# Patient Record
Sex: Female | Born: 1990 | Race: White | Hispanic: No | Marital: Single | Smoking: Never smoker
Health system: Southern US, Community
[De-identification: ages and names within clinical notes are randomized; demographics above are authoritative.]

## PROBLEM LIST (undated history)

## (undated) DIAGNOSIS — F419 Anxiety disorder, unspecified: Secondary | ICD-10-CM

## (undated) DIAGNOSIS — D649 Anemia, unspecified: Secondary | ICD-10-CM

## (undated) DIAGNOSIS — I499 Cardiac arrhythmia, unspecified: Secondary | ICD-10-CM

## (undated) DIAGNOSIS — T7840XA Allergy, unspecified, initial encounter: Secondary | ICD-10-CM

## (undated) HISTORY — PX: TONSILLECTOMY AND ADENOIDECTOMY: SUR1326

## (undated) HISTORY — DX: Allergy, unspecified, initial encounter: T78.40XA

## (undated) HISTORY — DX: Anxiety disorder, unspecified: F41.9

---
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
## 2011-07-17 ENCOUNTER — Emergency Department (HOSPITAL_COMMUNITY)
Admission: EM | Admit: 2011-07-17 | Discharge: 2011-07-17 | Disposition: A | Discharge status: Home / Self Care | Payer: 59 | Attending: Emergency Medicine | Admitting: Emergency Medicine

## 2011-07-17 ENCOUNTER — Emergency Department (HOSPITAL_COMMUNITY): Payer: 59

## 2011-07-17 DIAGNOSIS — M79609 Pain in unspecified limb: Secondary | ICD-10-CM | POA: Insufficient documentation

## 2011-07-17 DIAGNOSIS — R51 Headache: Secondary | ICD-10-CM | POA: Insufficient documentation

## 2011-07-17 DIAGNOSIS — S8010XA Contusion of unspecified lower leg, initial encounter: Secondary | ICD-10-CM | POA: Insufficient documentation

## 2011-07-17 DIAGNOSIS — R413 Other amnesia: Secondary | ICD-10-CM | POA: Insufficient documentation

## 2011-07-17 DIAGNOSIS — R079 Chest pain, unspecified: Secondary | ICD-10-CM | POA: Insufficient documentation

## 2011-07-17 DIAGNOSIS — R404 Transient alteration of awareness: Secondary | ICD-10-CM | POA: Insufficient documentation

## 2011-07-17 DIAGNOSIS — Z79899 Other long term (current) drug therapy: Secondary | ICD-10-CM | POA: Insufficient documentation

## 2011-07-17 DIAGNOSIS — IMO0002 Reserved for concepts with insufficient information to code with codable children: Secondary | ICD-10-CM | POA: Insufficient documentation
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
## 2013-01-06 ENCOUNTER — Encounter: Payer: Self-pay | Admitting: *Deleted

## 2013-01-08 ENCOUNTER — Ambulatory Visit (INDEPENDENT_AMBULATORY_CARE_PROVIDER_SITE_OTHER): Payer: 59 | Admitting: Family Medicine

## 2013-01-08 ENCOUNTER — Encounter: Payer: Self-pay | Admitting: Family Medicine

## 2013-01-08 VITALS — BP 112/82 | Temp 98.4°F | Wt 121.2 lb

## 2013-01-08 DIAGNOSIS — K2970 Gastritis, unspecified, without bleeding: Secondary | ICD-10-CM

## 2013-01-08 DIAGNOSIS — K2990 Gastroduodenitis, unspecified, without bleeding: Secondary | ICD-10-CM

## 2013-01-08 MED ORDER — PANTOPRAZOLE SODIUM 40 MG PO TBEC: 40.0000 mg | DELAYED_RELEASE_TABLET | Freq: Every day | ORAL | Status: DC

## 2013-01-08 MED ORDER — SUCRALFATE 1 G PO TABS: ORAL_TABLET | ORAL | Status: DC

## 2013-01-08 NOTE — Progress Notes (Signed)
  Subjective:    Patient ID: Linda Bryan, female    DOB: [birth year], 22 y.o.   MRN: [id number]  Emesis  This is a chronic problem. The current episode started more than 1 month ago. The problem has been gradually worsening. There has been no fever. Associated symptoms include abdominal pain. She has tried nothing for the symptoms. The treatment provided mild relief.  Abdominal Pain The current episode started more than 1 month ago. The problem occurs daily. The pain is located in the epigastric region. The pain is at a severity of 5/10. The pain is moderate. The quality of the pain is burning, aching and a sensation of fullness. The abdominal pain radiates to the epigastric region. Associated symptoms include vomiting. The pain is aggravated by NSAIDs. She has tried proton pump inhibitors for the symptoms. The treatment provided mild relief. There is no history of abdominal surgery.      Review of Systems  Constitutional: Positive for fatigue.  Gastrointestinal: Positive for vomiting and abdominal pain.  All other systems reviewed and are negative.       Objective:   Physical Exam   Alert no acute distress. HEENT normal. Vital signs reviewed. Lungs clear. Heart regular in rhythm. Epigastrium distinct tenderness. No rebound no guarding no masses.     Assessment & Plan:  Impression gastritis recurring throughout the past 6 months. No prior history. Discussed at great length. Plan H. pylori serology. If positive will treat. If negative we'll set up a GI specialist. Rationale discussed. Easily 25 minutes spent with patient most in discussion WSL

## 2013-01-08 NOTE — Patient Instructions (Addendum)
Helicobacter Pylori and Ulcer Disease  An ulcer may be in your stomach (gastric ulcer) or in the first part of your small bowel, which is called the duodenum (duodenal ulcer). An ulcer is a break in the stomach or duodenum lining. The break wears down into the deeper tissue. Helicobacter pylori (H. pylori) is a type of germ (bacteria) that may cause the majority of gastric or duodenal ulcers.  CAUSES   · A germ (bacterium). H. pylori can weaken the protective mucous coating of the stomach and duodenum. This allows acid to get through to the sensitive lining of the stomach or duodenum and an ulcer can then form.  · Certain medications.  · Using substances that can bother the lining of the stomach (alcohol, tobacco or medications such as Advil or Motrin) in the presence of H.pylori infection. This can increase the chances of getting an ulcer.  · Cancer (rarely).  Most people infected with H. pylori do not get ulcers. It is not known how people catch H. pylori. It may be through food or water. H. pylori has been found in the saliva of some infected people. Therefore, the bacteria may also spread through mouth-to-mouth contact such as kissing.  SYMPTOMS   The problems (symptoms) of ulcer disease are usually:  · A burning or gnawing of the mid-upper belly (abdomen). This is often worse on an empty stomach. It may get better with food. This may be associated with feeling sick to your stomach (nausea), bloating and vomiting.  · If the ulcer results in bleeding, it can cause:  · Black, tarry stools.  · Throwing up bright red blood.  · Throwing up coffee ground looking materials.  With severe bleeding, there may be loss of consciousness and shock.  Besides ulcer disease, H. pylori can also cause chronic gastritis (irritation of the lining of the stomach without ulcer) or stomach acid-type discomfort. You may not have symptoms even though you have an H. pylori infection. Although this is an infection, you may not have usual  infection symptoms (such as fever).  DIAGNOSIS   Ulcer disease can be diagnosed in many different ways. If you have an ulcer, it is important to know whether or not it is caused by H. Pylori. Treatment for an ulcer caused by H. pylori is different from that for an ulcer with other causes. The best way to detect H. pylori is taking tissue directly from the ulcer during an endoscopy test.   · An endoscopy is an exam that uses an endoscope. This is a thin, lighted tube with a small camera on the end. It is like a flexible telescope. The patient is given a drug to make them calm (sedative). The caregiver eases the endoscope into the mouth and down the throat to the stomach and duodenum. This allows the doctor to see the lining of the esophagus, stomach and duodenum.  · If an endoscopy is not needed, then H. pylori can be detected with tests of the blood, stool or even breath.  TREATMENT   · H. pylori peptic ulcer treatment usually involves a combination of:  · Medicines that kill germs (antibiotics).  · Acid suppressors.  · Stomach protectors.  · The use of only one medication to treat H. pylori is not recommended. The most proven treatment is a 2 week course of treatment called triple therapy. It involves taking two antibiotics to kill the bacteria and either an acid suppressor or stomach-lining shield. Two-week triple therapy reduces ulcer symptoms,   kills the bacteria, and prevents ulcers from coming back in many patients.  · Unfortunately, patients may find triple therapy hard to do. This is because it involves taking as many as 20 pills a day. Also, the antibiotics used in triple therapy may cause mild side effects. These include nausea, vomiting, diarrhea, dark stools, a metallic taste in the mouth, dizziness, headache and yeast infections in women. Talk to your caregiver if you have any of these side effects.  HOME CARE INSTRUCTIONS   · Take your medications as directed and for as long as prescribed. Contact your  caregiver if you have problems or side effects from your medications.  · Continue regular work and usual activities unless told otherwise by your caregiver.  · Avoid tobacco, alcohol and caffeine. Tobacco use will decrease and slow healing.  · Avoid medications that are harmful. This includes aspirin and NSAIDS such as ibuprofen and naproxen.  · Avoid foods that seem to aggravate or cause discomfort.  · There are many over-the-counter products available to control stomach acid and other symptoms. Discuss these with your caregiver before using them. Do not  stop taking prescription medications for over-the-counter medications without talking with your caregiver.  · Special diets are not usually needed.  · Keep any follow-up appointments and blood tests as directed.  SEEK MEDICAL CARE IF:   · Your pain or other ulcer symptoms do not improve within a few days of starting treatment.  · You develop diarrhea. This can be a problem related to certain treatments.  · You have ongoing indigestion or heartburn even if your main ulcer symptoms are improved.  · You think you have any side effects from your medications or if you do not understand how to use your medications right.  SEEK IMMEDIATE MEDICAL CARE IF:   Any of the following happen:  · You develop bright red, rectal bleeding.  · You develop dark black, tarry stools.  · You throw up (vomit) blood.  · You become light-headed, weak, have fainting episodes, or become sweaty, cold and clammy.  · You have severe abdominal pain not controlled by medications. Do not take pain medications unless ordered by your caregiver.  MAKE SURE YOU:   · Understand these instructions.  · Will watch your condition.  · Will get help right away if you are not doing well or get worse.  Document Released: 12/16/2003 Document Revised: 12/18/2011 Document Reviewed: 05/14/2008  [employer]® Patient Information ©2013 [employer].

## 2013-01-09 LAB — H. PYLORI ANTIBODY, IGG: H Pylori IgG: 0.51 {ISR}

## 2013-01-23 ENCOUNTER — Telehealth: Payer: Self-pay | Admitting: Family Medicine

## 2013-01-23 DIAGNOSIS — R109 Unspecified abdominal pain: Secondary | ICD-10-CM

## 2013-01-23 NOTE — Telephone Encounter (Signed)
Referral ordered and sent to referral coordinator.

## 2013-01-23 NOTE — Telephone Encounter (Signed)
See result note on lab.(H pylori) I think that was before nurses were doing the referral and this fell thru the cracks. plz order referral so brendale can do

## 2013-01-23 NOTE — Telephone Encounter (Signed)
Patient called, ? If GI referral has been done?  Never came to my workque, please initiate in system so I may proceed

## 2013-02-12 ENCOUNTER — Other Ambulatory Visit: Payer: Self-pay | Admitting: Gastroenterology

## 2013-02-12 DIAGNOSIS — R1013 Epigastric pain: Secondary | ICD-10-CM

## 2013-02-12 DIAGNOSIS — R112 Nausea with vomiting, unspecified: Secondary | ICD-10-CM

## 2013-02-14 SURGERY — Surgical Case
Anesthesia: *Unknown

## 2013-03-04 ENCOUNTER — Encounter (HOSPITAL_COMMUNITY): Admission: RE | Admit: 2013-03-04 | Payer: 59 | Source: Ambulatory Visit

## 2013-03-04 ENCOUNTER — Ambulatory Visit (HOSPITAL_COMMUNITY)
Admission: RE | Admit: 2013-03-04 | Discharge: 2013-03-04 | Disposition: A | Discharge status: Home / Self Care | Payer: 59 | Source: Ambulatory Visit | Attending: Gastroenterology | Admitting: Gastroenterology

## 2013-03-04 DIAGNOSIS — R1013 Epigastric pain: Secondary | ICD-10-CM | POA: Insufficient documentation

## 2013-03-04 DIAGNOSIS — R112 Nausea with vomiting, unspecified: Secondary | ICD-10-CM

## 2013-03-04 DIAGNOSIS — K8020 Calculus of gallbladder without cholecystitis without obstruction: Secondary | ICD-10-CM | POA: Insufficient documentation

## 2013-03-06 ENCOUNTER — Encounter (HOSPITAL_COMMUNITY): Payer: No Typology Code available for payment source

## 2013-03-17 ENCOUNTER — Encounter (INDEPENDENT_AMBULATORY_CARE_PROVIDER_SITE_OTHER): Payer: Self-pay

## 2013-03-18 ENCOUNTER — Ambulatory Visit (INDEPENDENT_AMBULATORY_CARE_PROVIDER_SITE_OTHER): Payer: 59 | Admitting: General Surgery

## 2013-03-18 ENCOUNTER — Encounter (INDEPENDENT_AMBULATORY_CARE_PROVIDER_SITE_OTHER): Payer: Self-pay

## 2013-03-18 ENCOUNTER — Encounter (INDEPENDENT_AMBULATORY_CARE_PROVIDER_SITE_OTHER): Payer: Self-pay | Admitting: General Surgery

## 2013-03-18 VITALS — BP 110/76 | HR 96 | Resp 14 | Ht 64.0 in | Wt 115.0 lb

## 2013-03-18 DIAGNOSIS — K8020 Calculus of gallbladder without cholecystitis without obstruction: Secondary | ICD-10-CM

## 2013-03-18 DIAGNOSIS — K811 Chronic cholecystitis: Secondary | ICD-10-CM | POA: Insufficient documentation

## 2013-03-18 NOTE — Progress Notes (Signed)
Chief complaint:  Abdominal pain  HISTORY: Pt is a 22 yo female with pain since October 2013.  She started with a sense of fullness after eating, and now has frank pain after eating about 5 bites of food.  She has some weight loss.  She thinks that greasy foods make it worse.  She denies heartburn.  She has constant pain, but has worsening with food.  She has had negative EGD.  She has had some diarrhea, but no constipation.  She denies fevers/ chills.  She constantly feels nauseated.    Past Medical History  Diagnosis Date  . Allergy   . Anxiety     Past Surgical History  Procedure Laterality Date  . Tonsillectomy and adenoidectomy    . Lithotripsy  2010    Current Outpatient Prescriptions  Medication Sig Dispense Refill  . PARoxetine (PAXIL-CR) 25 MG 24 hr tablet Take 25 mg by mouth every morning.      . propranolol ER (INDERAL LA) 120 MG 24 hr capsule Take 120 mg by mouth daily.      . pantoprazole (PROTONIX) 40 MG tablet Take 1 tablet (40 mg total) by mouth daily.  30 tablet  1  . sucralfate (CARAFATE) 1 G tablet One tab ac and hs  60 tablet  0   No current facility-administered medications for this visit.     No Known Allergies   History reviewed. No pertinent family history.   History   Social History  . Marital Status: Single    Spouse Name: N/A    Number of Children: N/A  . Years of Education: N/A   Social History Main Topics  . Smoking status: Never Smoker   . Smokeless tobacco: None  . Alcohol Use: Yes  . Drug Use: No  . Sexually Active: None    REVIEW OF SYSTEMS - PERTINENT POSITIVES ONLY: 12 point review of systems negative other than HPI and PMH except for nausea, abdominal pain, vomiting  EXAM: Filed Vitals:   03/18/13 1337  BP: 110/76  Pulse: 96  Resp: 14   Filed Weights   03/18/13 1337  Weight: 115 lb (52.164 kg)   .  Gen:  No acute distress.  Well nourished and well groomed.   Neurological: Alert and oriented to person, place, and  time. Coordination normal.  Head: Normocephalic and atraumatic.  Eyes: Conjunctivae are normal. Pupils are equal, round, and reactive to light. No scleral icterus.  Neck: Normal range of motion. Neck supple. No tracheal deviation or thyromegaly present.  Cardiovascular: Normal rate, regular rhythm, normal heart sounds and intact distal pulses.  Exam reveals no gallop and no friction rub.  No murmur heard. Respiratory: Effort normal.  No respiratory distress. No chest wall tenderness. Breath sounds normal.  No wheezes, rales or rhonchi.  GI: Soft. Bowel sounds are normal. The abdomen is soft and nondistended.  There is mild epigastric tenderness.  There is no rebound and no guarding.  Musculoskeletal: Normal range of motion. Extremities are nontender.  Lymphadenopathy: No cervical, preauricular, postauricular or axillary adenopathy is present Skin: Skin is warm and dry. No rash noted. No diaphoresis. No erythema. No pallor. No clubbing, cyanosis, or edema.   Psychiatric: Normal mood and affect. Behavior is normal. Judgment and thought content normal.    LABORATORY RESULTS: Available labs are reviewed  CBC, CMET completely normal.  RADIOLOGY RESULTS: See E-Chart or I-Site for most recent results.  Images and reports are reviewed. RUQ u/s  IMPRESSION:  Solitary nonshadowing mobile  gallstone as described above with no  signs of associated cholecystitis.    ASSESSMENT AND PLAN: Cholelithiasis Pt with symptoms of chronic cholecystitis and cholelithiasis.  Will plan removal of gallbladder.  The surgical procedure was described to the patient in detail.  The patient was given Agricultural engineer. .  I discussed the incision type and location, the location of the gallbladder, the anatomy of the bile ducts and arteries, and the typical progression of surgery.  I discussed the possibility of converting to an open operation.  I advised of the risks of bleeding, infection, damage to other  structures (such as the bile duct, intestine or liver), bile leak, need for other procedures or surgeries, and post op diarrhea/constipation.  We discussed the risk of blood clot.  We discussed the recovery period and post operative restrictions.  The patient was advised against taking blood thinners the week before surgery.           Maudry Diego MD Surgical Oncology, General and Endocrine Surgery [facility]      Visit Diagnoses: 1. Cholelithiasis     Primary Care Physician: Harlow Asa, MD

## 2013-03-18 NOTE — Assessment & Plan Note (Signed)
Pt with symptoms of chronic cholecystitis and cholelithiasis.  Will plan removal of gallbladder.  The surgical procedure was described to the patient in detail.  The patient was given Agricultural engineer. .  I discussed the incision type and location, the location of the gallbladder, the anatomy of the bile ducts and arteries, and the typical progression of surgery.  I discussed the possibility of converting to an open operation.  I advised of the risks of bleeding, infection, damage to other structures (such as the bile duct, intestine or liver), bile leak, need for other procedures or surgeries, and post op diarrhea/constipation.  We discussed the risk of blood clot.  We discussed the recovery period and post operative restrictions.  The patient was advised against taking blood thinners the week before surgery.

## 2013-03-18 NOTE — Patient Instructions (Addendum)

## 2013-03-26 ENCOUNTER — Encounter (INDEPENDENT_AMBULATORY_CARE_PROVIDER_SITE_OTHER): Payer: Self-pay

## 2013-03-26 NOTE — Patient Instructions (Addendum)
21 ADRI[address]NE TROMBETTA  03/26/2013   Your procedure is scheduled on: 04/02/13  Report to [facility] at 06:30 AM.  Call this number if you have problems the morning of surgery [phone]: [phone]   Remember:   Do not eat food or drink liquids After Midnight.     Take these medicines the morning of surgery with A SIP OF WATER: paxil, propranolol   Do not wear jewelry, make-up or nail polish.  Do not wear lotions, powders, or perfumes. You may wear deodorant.  Do not shave 48 hours prior to surgery. Men may shave face and neck.  Do not bring valuables to the hospital.  Contacts, dentures or bridgework may not be worn into surgery.   Patients discharged the day of surgery will not be allowed to drive home.  Name and phone number of your driver: Marylu Lund (mom) [phone]   Please read over the following fact sheets that you were given: MRSA Information.  Birdie Sons, RN  pre op nurse call if needed [phone]    FAILURE TO FOLLOW THESE INSTRUCTIONS MAY RESULT IN CANCELLATION OF YOUR SURGERY   Patient Signature: ___________________________________________

## 2013-03-26 NOTE — Progress Notes (Signed)
Please write pre-op orders for presurgical appointment 03/27/13 0800.

## 2013-03-27 ENCOUNTER — Encounter (HOSPITAL_COMMUNITY)
Admission: RE | Admit: 2013-03-27 | Discharge: 2013-03-27 | Disposition: A | Discharge status: Home / Self Care | Payer: 59 | Source: Ambulatory Visit | Attending: General Surgery | Admitting: General Surgery

## 2013-03-27 ENCOUNTER — Ambulatory Visit (HOSPITAL_COMMUNITY)
Admission: RE | Admit: 2013-03-27 | Discharge: 2013-03-27 | Disposition: A | Discharge status: Home / Self Care | Payer: 59 | Source: Ambulatory Visit | Attending: General Surgery | Admitting: General Surgery

## 2013-03-27 ENCOUNTER — Encounter (HOSPITAL_COMMUNITY): Payer: Self-pay

## 2013-03-27 ENCOUNTER — Encounter (HOSPITAL_COMMUNITY): Payer: Self-pay | Admitting: Pharmacy Technician

## 2013-03-27 DIAGNOSIS — Z01818 Encounter for other preprocedural examination: Secondary | ICD-10-CM | POA: Insufficient documentation

## 2013-03-27 HISTORY — DX: Anemia, unspecified: D64.9

## 2013-03-27 HISTORY — DX: Cardiac arrhythmia, unspecified: I49.9

## 2013-03-27 LAB — CBC
Platelets: 205 10*3/uL (ref 150–400)
RDW: 12.7 % (ref 11.5–15.5)
WBC: 10.5 10*3/uL (ref 4.0–10.5)

## 2013-03-27 LAB — BASIC METABOLIC PANEL
Chloride: 105 mEq/L (ref 96–112)
GFR calc Af Amer: >90 mL/min (ref >90)
Potassium: 4.3 mEq/L (ref 3.5–5.1)
Sodium: 140 mEq/L (ref 135–145)

## 2013-03-27 LAB — SURGICAL PCR SCREEN
MRSA, PCR: NEGATIVE
Staphylococcus aureus: NEGATIVE

## 2013-03-27 LAB — HCG, SERUM, QUALITATIVE: Preg, Serum: NEGATIVE

## 2013-04-01 ENCOUNTER — Other Ambulatory Visit (INDEPENDENT_AMBULATORY_CARE_PROVIDER_SITE_OTHER): Payer: Self-pay | Admitting: General Surgery

## 2013-04-02 ENCOUNTER — Encounter (INDEPENDENT_AMBULATORY_CARE_PROVIDER_SITE_OTHER): Payer: Self-pay

## 2013-04-02 ENCOUNTER — Encounter (HOSPITAL_COMMUNITY): Payer: Self-pay | Admitting: *Deleted

## 2013-04-02 ENCOUNTER — Ambulatory Visit (HOSPITAL_COMMUNITY)
Admission: RE | Admit: 2013-04-02 | Discharge: 2013-04-02 | Disposition: A | Discharge status: Home / Self Care | Payer: 59 | Source: Ambulatory Visit | Attending: General Surgery | Admitting: General Surgery

## 2013-04-02 ENCOUNTER — Encounter (HOSPITAL_COMMUNITY): Payer: Self-pay | Admitting: Certified Registered"

## 2013-04-02 ENCOUNTER — Encounter (HOSPITAL_COMMUNITY)
Admission: RE | Disposition: A | Discharge status: Home / Self Care | Payer: Self-pay | Source: Ambulatory Visit | Attending: General Surgery

## 2013-04-02 ENCOUNTER — Ambulatory Visit (HOSPITAL_COMMUNITY): Payer: 59 | Admitting: Certified Registered"

## 2013-04-02 DIAGNOSIS — K8010 Calculus of gallbladder with chronic cholecystitis without obstruction: Secondary | ICD-10-CM | POA: Insufficient documentation

## 2013-04-02 DIAGNOSIS — Z79899 Other long term (current) drug therapy: Secondary | ICD-10-CM | POA: Insufficient documentation

## 2013-04-02 DIAGNOSIS — I498 Other specified cardiac arrhythmias: Secondary | ICD-10-CM | POA: Insufficient documentation

## 2013-04-02 DIAGNOSIS — K811 Chronic cholecystitis: Secondary | ICD-10-CM

## 2013-04-02 HISTORY — PX: CHOLECYSTECTOMY: SHX55

## 2013-04-02 LAB — URINALYSIS, ROUTINE W REFLEX MICROSCOPIC
Bilirubin Urine: NEGATIVE
Ketones, ur: NEGATIVE mg/dL
Leukocytes, UA: NEGATIVE
Nitrite: NEGATIVE
Protein, ur: NEGATIVE mg/dL
Urobilinogen, UA: 1 mg/dL (ref 0.0–1.0)
pH: 6.5 (ref 5.0–8.0)

## 2013-04-02 LAB — URINE MICROSCOPIC-ADD ON

## 2013-04-02 SURGERY — LAPAROSCOPIC CHOLECYSTECTOMY
Anesthesia: General | Wound class: Clean Contaminated

## 2013-04-02 MED ORDER — LACTATED RINGERS IR SOLN
Status: DC | PRN
  Administered 2013-04-02: 1

## 2013-04-02 MED ORDER — DEXTROSE 5 % IV SOLN
2.0000 g | INTRAVENOUS | Status: AC
  Administered 2013-04-02: 2 g via INTRAVENOUS
  Filled 2013-04-02: qty 2

## 2013-04-02 MED ORDER — GLYCOPYRROLATE 0.2 MG/ML IJ SOLN
INTRAMUSCULAR | Status: DC | PRN
  Administered 2013-04-02: .5 mg via INTRAVENOUS

## 2013-04-02 MED ORDER — ONDANSETRON HCL 4 MG/2ML IJ SOLN
INTRAMUSCULAR | Status: DC | PRN
  Administered 2013-04-02: 4 mg via INTRAVENOUS

## 2013-04-02 MED ORDER — MIDAZOLAM HCL 5 MG/5ML IJ SOLN
INTRAMUSCULAR | Status: DC | PRN
  Administered 2013-04-02: 2 mg via INTRAVENOUS

## 2013-04-02 MED ORDER — DEXAMETHASONE SODIUM PHOSPHATE 10 MG/ML IJ SOLN
INTRAMUSCULAR | Status: DC | PRN
  Administered 2013-04-02: 10 mg via INTRAVENOUS

## 2013-04-02 MED ORDER — IOHEXOL 300 MG/ML  SOLN
INTRAMUSCULAR | Status: AC
  Filled 2013-04-02: qty 1

## 2013-04-02 MED ORDER — OXYCODONE HCL 5 MG PO TABS
5.0000 mg | ORAL_TABLET | ORAL | Status: DC | PRN
  Administered 2013-04-02: 5 mg via ORAL
  Filled 2013-04-02: qty 1

## 2013-04-02 MED ORDER — LIDOCAINE HCL 1 % IJ SOLN
INTRAMUSCULAR | Status: DC | PRN
  Administered 2013-04-02: 7.5 mL

## 2013-04-02 MED ORDER — LIDOCAINE HCL 1 % IJ SOLN
INTRAMUSCULAR | Status: AC
  Filled 2013-04-02: qty 20

## 2013-04-02 MED ORDER — HYDROMORPHONE HCL PF 1 MG/ML IJ SOLN
INTRAMUSCULAR | Status: AC
  Filled 2013-04-02: qty 1

## 2013-04-02 MED ORDER — CEFOXITIN SODIUM-DEXTROSE 1-4 GM-% IV SOLR (PREMIX)
INTRAVENOUS | Status: AC
  Filled 2013-04-02: qty 100

## 2013-04-02 MED ORDER — OXYCODONE-ACETAMINOPHEN 5-325 MG PO TABS: 1.0000 | ORAL_TABLET | ORAL | Status: DC | PRN

## 2013-04-02 MED ORDER — BUPIVACAINE-EPINEPHRINE PF 0.25-1:200000 % IJ SOLN
INTRAMUSCULAR | Status: AC
  Filled 2013-04-02: qty 30

## 2013-04-02 MED ORDER — LIDOCAINE HCL (PF) 2 % IJ SOLN
INTRAMUSCULAR | Status: DC | PRN
  Administered 2013-04-02: 20 mg

## 2013-04-02 MED ORDER — FENTANYL CITRATE 0.05 MG/ML IJ SOLN
INTRAMUSCULAR | Status: DC | PRN
  Administered 2013-04-02: 100 ug via INTRAVENOUS
  Administered 2013-04-02 (×3): 50 ug via INTRAVENOUS

## 2013-04-02 MED ORDER — PROPOFOL 10 MG/ML IV BOLUS
INTRAVENOUS | Status: DC | PRN
  Administered 2013-04-02: 130 mg via INTRAVENOUS

## 2013-04-02 MED ORDER — ONDANSETRON HCL 4 MG/2ML IJ SOLN
4.0000 mg | Freq: Four times a day (QID) | INTRAMUSCULAR | Status: DC | PRN
  Administered 2013-04-02: 4 mg via INTRAVENOUS
  Filled 2013-04-02: qty 2

## 2013-04-02 MED ORDER — 0.9 % SODIUM CHLORIDE (POUR BTL) OPTIME
TOPICAL | Status: DC | PRN
  Administered 2013-04-02: 1000 mL

## 2013-04-02 MED ORDER — NEOSTIGMINE METHYLSULFATE 1 MG/ML IJ SOLN
INTRAMUSCULAR | Status: DC | PRN
  Administered 2013-04-02: 3 mg via INTRAVENOUS

## 2013-04-02 MED ORDER — HYDROMORPHONE HCL PF 1 MG/ML IJ SOLN
0.2500 mg | INTRAMUSCULAR | Status: DC | PRN
  Administered 2013-04-02 (×2): 0.5 mg via INTRAVENOUS

## 2013-04-02 MED ORDER — LACTATED RINGERS IV SOLN: INTRAVENOUS | Status: DC

## 2013-04-02 MED ORDER — PROMETHAZINE HCL 25 MG/ML IJ SOLN: 6.2500 mg | INTRAMUSCULAR | Status: DC | PRN

## 2013-04-02 MED ORDER — BUPIVACAINE-EPINEPHRINE 0.25% -1:200000 IJ SOLN
INTRAMUSCULAR | Status: DC | PRN
  Administered 2013-04-02: 7.5 mL

## 2013-04-02 MED ORDER — LACTATED RINGERS IV SOLN
INTRAVENOUS | Status: DC | PRN
  Administered 2013-04-02: 08:00:00 via INTRAVENOUS

## 2013-04-02 MED ORDER — PROPRANOLOL HCL ER 120 MG PO CP24
120.0000 mg | ORAL_CAPSULE | Freq: Once | ORAL | Status: AC
  Administered 2013-04-02: 120 mg via ORAL
  Filled 2013-04-02: qty 1

## 2013-04-02 MED ORDER — ROCURONIUM BROMIDE 100 MG/10ML IV SOLN
INTRAVENOUS | Status: DC | PRN
  Administered 2013-04-02: 35 mg via INTRAVENOUS
  Administered 2013-04-02: 10 mg via INTRAVENOUS

## 2013-04-02 SURGICAL SUPPLY — 46 items
ADH SKN CLS APL DERMABOND .7 (GAUZE/BANDAGES/DRESSINGS) ×1
APL SKNCLS STERI-STRIP NONHPOA (GAUZE/BANDAGES/DRESSINGS)
APPLIER CLIP 5 13 M/L LIGAMAX5 (MISCELLANEOUS) ×2
APPLIER CLIP ROT 10 11.4 M/L (STAPLE)
APR CLP MED LRG 11.4X10 (STAPLE)
APR CLP MED LRG 5 ANG JAW (MISCELLANEOUS) ×1
BAG SPEC RTRVL LRG 6X4 10 (ENDOMECHANICALS) ×1
BENZOIN TINCTURE PRP APPL 2/3 (GAUZE/BANDAGES/DRESSINGS) IMPLANT
CANISTER SUCTION 2500CC (MISCELLANEOUS) ×2 IMPLANT
CHLORAPREP W/TINT 26ML (MISCELLANEOUS) ×1 IMPLANT
CLIP APPLIE 5 13 M/L LIGAMAX5 (MISCELLANEOUS) ×1 IMPLANT
CLIP APPLIE ROT 10 11.4 M/L (STAPLE) IMPLANT
CLOTH BEACON ORANGE TIMEOUT ST (SAFETY) ×2 IMPLANT
COVER MAYO STAND STRL (DRAPES) ×1 IMPLANT
DECANTER SPIKE VIAL GLASS SM (MISCELLANEOUS) ×2 IMPLANT
DERMABOND ADVANCED (GAUZE/BANDAGES/DRESSINGS) ×1
DERMABOND ADVANCED .7 DNX12 (GAUZE/BANDAGES/DRESSINGS) IMPLANT
DRAPE C-ARM 42X120 X-RAY (DRAPES) ×1 IMPLANT
DRAPE LAPAROSCOPIC ABDOMINAL (DRAPES) ×2 IMPLANT
DRAPE UTILITY XL STRL (DRAPES) ×1 IMPLANT
DRAPE WARM FLUID 44X44 (DRAPE) ×1 IMPLANT
ELECT REM PT RETURN 9FT ADLT (ELECTROSURGICAL) ×2
ELECTRODE REM PT RTRN 9FT ADLT (ELECTROSURGICAL) ×1 IMPLANT
GLOVE BIO SURGEON STRL SZ7 (GLOVE) ×2 IMPLANT
GLOVE BIOGEL PI IND STRL 7.5 (GLOVE) ×1 IMPLANT
GLOVE BIOGEL PI INDICATOR 7.5 (GLOVE) ×1
GOWN PREVENTION PLUS LG XLONG (DISPOSABLE) ×1 IMPLANT
GOWN PREVENTION PLUS XLARGE (GOWN DISPOSABLE) ×1 IMPLANT
GOWN STRL NON-REIN LRG LVL3 (GOWN DISPOSABLE) ×1 IMPLANT
GOWN STRL REIN 2XL LVL4 (GOWN DISPOSABLE) ×2 IMPLANT
GOWN STRL REIN XL XLG (GOWN DISPOSABLE) ×3 IMPLANT
KIT BASIN OR (CUSTOM PROCEDURE TRAY) ×2 IMPLANT
NS IRRIG 1000ML POUR BTL (IV SOLUTION) ×2 IMPLANT
POUCH SPECIMEN RETRIEVAL 10MM (ENDOMECHANICALS) ×2 IMPLANT
SET CHOLANGIOGRAPH MIX (MISCELLANEOUS) ×1 IMPLANT
SET IRRIG TUBING LAPAROSCOPIC (IRRIGATION / IRRIGATOR) ×2 IMPLANT
SOLUTION ANTI FOG 6CC (MISCELLANEOUS) ×2 IMPLANT
STRIP CLOSURE SKIN 1/2X4 (GAUZE/BANDAGES/DRESSINGS) IMPLANT
SUT MNCRL AB 4-0 PS2 18 (SUTURE) ×2 IMPLANT
TOWEL OR 17X26 10 PK STRL BLUE (TOWEL DISPOSABLE) ×2 IMPLANT
TRAY LAP CHOLE (CUSTOM PROCEDURE TRAY) ×2 IMPLANT
TROCAR BLADELESS OPT 5 75 (ENDOMECHANICALS) ×2 IMPLANT
TROCAR SLEEVE XCEL 5X75 (ENDOMECHANICALS) ×3 IMPLANT
TROCAR XCEL BLUNT TIP 100MML (ENDOMECHANICALS) ×2 IMPLANT
TROCAR XCEL NON-BLD 11X100MML (ENDOMECHANICALS) ×1 IMPLANT
TUBING INSUFFLATION 10FT LAP (TUBING) ×2 IMPLANT

## 2013-04-02 NOTE — H&P (View-Only) (Signed)
Chief complaint:  Abdominal pain  HISTORY: Pt is a 22 yo female with pain since October 2013.  She started with a sense of fullness after eating, and now has frank pain after eating about 5 bites of food.  She has some weight loss.  She thinks that greasy foods make it worse.  She denies heartburn.  She has constant pain, but has worsening with food.  She has had negative EGD.  She has had some diarrhea, but no constipation.  She denies fevers/ chills.  She constantly feels nauseated.    Past Medical History  Diagnosis Date  . Allergy   . Anxiety     Past Surgical History  Procedure Laterality Date  . Tonsillectomy and adenoidectomy    . Lithotripsy  2010    Current Outpatient Prescriptions  Medication Sig Dispense Refill  . PARoxetine (PAXIL-CR) 25 MG 24 hr tablet Take 25 mg by mouth every morning.      . propranolol ER (INDERAL LA) 120 MG 24 hr capsule Take 120 mg by mouth daily.      . pantoprazole (PROTONIX) 40 MG tablet Take 1 tablet (40 mg total) by mouth daily.  30 tablet  1  . sucralfate (CARAFATE) 1 G tablet One tab ac and hs  60 tablet  0   No current facility-administered medications for this visit.     No Known Allergies   History reviewed. No pertinent family history.   History   Social History  . Marital Status: Single    Spouse Name: N/A    Number of Children: N/A  . Years of Education: N/A   Social History Main Topics  . Smoking status: Never Smoker   . Smokeless tobacco: None  . Alcohol Use: Yes  . Drug Use: No  . Sexually Active: None    REVIEW OF SYSTEMS - PERTINENT POSITIVES ONLY: 12 point review of systems negative other than HPI and PMH except for nausea, abdominal pain, vomiting  EXAM: Filed Vitals:   03/18/13 1337  BP: 110/76  Pulse: 96  Resp: 14   Filed Weights   03/18/13 1337  Weight: 115 lb (52.164 kg)   .  Gen:  No acute distress.  Well nourished and well groomed.   Neurological: Alert and oriented to person, place, and  time. Coordination normal.  Head: Normocephalic and atraumatic.  Eyes: Conjunctivae are normal. Pupils are equal, round, and reactive to light. No scleral icterus.  Neck: Normal range of motion. Neck supple. No tracheal deviation or thyromegaly present.  Cardiovascular: Normal rate, regular rhythm, normal heart sounds and intact distal pulses.  Exam reveals no gallop and no friction rub.  No murmur heard. Respiratory: Effort normal.  No respiratory distress. No chest wall tenderness. Breath sounds normal.  No wheezes, rales or rhonchi.  GI: Soft. Bowel sounds are normal. The abdomen is soft and nondistended.  There is mild epigastric tenderness.  There is no rebound and no guarding.  Musculoskeletal: Normal range of motion. Extremities are nontender.  Lymphadenopathy: No cervical, preauricular, postauricular or axillary adenopathy is present Skin: Skin is warm and dry. No rash noted. No diaphoresis. No erythema. No pallor. No clubbing, cyanosis, or edema.   Psychiatric: Normal mood and affect. Behavior is normal. Judgment and thought content normal.    LABORATORY RESULTS: Available labs are reviewed  CBC, CMET completely normal.  RADIOLOGY RESULTS: See E-Chart or I-Site for most recent results.  Images and reports are reviewed. RUQ u/s  IMPRESSION:  Solitary nonshadowing mobile  gallstone as described above with no  signs of associated cholecystitis.    ASSESSMENT AND PLAN: Cholelithiasis Pt with symptoms of chronic cholecystitis and cholelithiasis.  Will plan removal of gallbladder.  The surgical procedure was described to the patient in detail.  The patient was given Agricultural engineer. .  I discussed the incision type and location, the location of the gallbladder, the anatomy of the bile ducts and arteries, and the typical progression of surgery.  I discussed the possibility of converting to an open operation.  I advised of the risks of bleeding, infection, damage to other  structures (such as the bile duct, intestine or liver), bile leak, need for other procedures or surgeries, and post op diarrhea/constipation.  We discussed the risk of blood clot.  We discussed the recovery period and post operative restrictions.  The patient was advised against taking blood thinners the week before surgery.           Maudry Diego MD Surgical Oncology, General and Endocrine Surgery [facility]      Visit Diagnoses: 1. Cholelithiasis     Primary Care Physician: Harlow Asa, MD

## 2013-04-02 NOTE — Progress Notes (Signed)
Spoke with Dr. Donell Beers regarding CMP order- She states she does not need it that pt has had recent bloodwork.  CMP discontinued.  Also spoke with Dr. Rica Mast regarding pt not taking her propanolol this am prior to surger.  Her last dose was 6/24 /14 at 1600.  He did ask that we give her a dose prior to surgery.

## 2013-04-02 NOTE — Anesthesia Preprocedure Evaluation (Addendum)
Anesthesia Evaluation  Patient identified by MRN, date of birth, ID band Patient awake    Reviewed: Allergy & Precautions, H&P , NPO status , Patient's Chart, lab work & pertinent test results  Airway Mallampati: II TM Distance: >3 FB Neck ROM: Full    Dental  (+) Dental Advisory Given and Teeth Intact,    Pulmonary neg pulmonary ROS,  breath sounds clear to auscultation  Pulmonary exam normal       Cardiovascular + dysrhythmias Supra Ventricular Tachycardia Rhythm:Regular Rate:Normal     Neuro/Psych negative neurological ROS  negative psych ROS   GI/Hepatic negative GI ROS, Neg liver ROS,   Endo/Other  negative endocrine ROS  Renal/GU negative Renal ROS  negative genitourinary   Musculoskeletal negative musculoskeletal ROS (+)   Abdominal   Peds  Hematology negative hematology ROS (+)   Anesthesia Other Findings   Reproductive/Obstetrics                          Anesthesia Physical Anesthesia Plan  ASA: II  Anesthesia Plan: General   Post-op Pain Management:    Induction: Intravenous  Airway Management Planned: Oral ETT  Additional Equipment:   Intra-op Plan:   Post-operative Plan: Extubation in OR  Informed Consent: I have reviewed the patients History and Physical, chart, labs and discussed the procedure including the risks, benefits and alternatives for the proposed anesthesia with the patient or authorized representative who has indicated his/her understanding and acceptance.   Dental advisory given  Plan Discussed with: CRNA  Anesthesia Plan Comments:         Anesthesia Quick Evaluation

## 2013-04-02 NOTE — Anesthesia Postprocedure Evaluation (Signed)
Anesthesia Post Note  Patient: Linda Bryan  Procedure(s) Performed: Procedure(s) (LRB): LAPAROSCOPIC CHOLECYSTECTOMY (N/A)  Anesthesia type: General  Patient location: PACU  Post pain: Pain level controlled  Post assessment: Post-op Vital signs reviewed  Last Vitals:  Filed Vitals:   04/02/13 1054  BP: 118/74  Pulse: 63  Temp: 36.8 C  Resp: 16    Post vital signs: Reviewed  Level of consciousness: sedated  Complications: No apparent anesthesia complications

## 2013-04-02 NOTE — Progress Notes (Signed)
BMP done at PST appt per anesthesia protocol.  No orders were placed for that appt then CMP ordered after PST appt.  BMP was done at her PST appt.

## 2013-04-02 NOTE — Op Note (Signed)
Laparoscopic Cholecystectomy   Indications: This patient presents with chronic cholecystitis and cholelithiasis and will undergo laparoscopic cholecystectomy.  Pre-operative Diagnosis: see above   Post-operative Diagnosis: Same  Surgeon: Almond Lint   Assistants: none  Anesthesia: General endotracheal anesthesia and local  Procedure Details  The patient was seen again in the Holding Room. The risks, benefits, complications, treatment options, and expected outcomes were discussed with the patient. The possibilities of  bleeding, recurrent infection, damage to nearby structures, the need for additional procedures, failure to diagnose a condition, the possible need to convert to an open procedure, and creating a complication requiring transfusion or operation were discussed with the patient. The likelihood of improving the patient's symptoms with return to their baseline status is good.    The patient and/or family concurred with the proposed plan, giving informed consent. The site of surgery properly noted. The patient was taken to Operating Room, and the procedure verified as Laparoscopic Cholecystectomy with possible Intraoperative Cholangiogram. A Time Out was held and the above information confirmed.  Prior to the induction of general anesthesia, antibiotic prophylaxis was administered. General endotracheal anesthesia was then administered and tolerated well. After the induction, the abdomen was prepped with Chloraprep and draped in the sterile fashion. The patient was positioned in the supine position.  Local anesthetic agent was injected into the skin near the umbilicus and an incision made. We dissected down to the abdominal fascia with blunt dissection.  The fascia was incised vertically and we entered the peritoneal cavity bluntly.  A pursestring suture of 0-Vicryl was placed around the fascial opening.  The Hasson cannula was inserted and secured with the stay suture.  Pneumoperitoneum  was then created with CO2 and tolerated well without any adverse changes in the patient's vital signs. An 11-mm port was placed in the subxiphoid position.  Two 5-mm ports were placed in the right upper quadrant. All skin incisions were infiltrated with a local anesthetic agent before making the incision and placing the trocars.   We positioned the patient in reverse Trendelenburg, tilted slightly to the patient's left.  The gallbladder was identified, the fundus grasped and retracted cephalad. Adhesions were lysed bluntly and with the electrocautery where indicated, taking care not to injure any adjacent organs or viscus. The infundibulum was grasped and retracted laterally, exposing the peritoneum overlying the triangle of Calot. This was then divided and exposed in a blunt fashion. A critical view of the cystic duct and cystic artery was obtained.  There was a small anterior arterial branch associated with the node of Calot.      The cystic duct was then ligated with clips and divided. The cystic artery was identified, dissected free, ligated with clips and divided as well.   The gallbladder was dissected from the liver bed in retrograde fashion with the electrocautery. The gallbladder was removed and placed in an Endocatch bag.  The gallbladder and Endocatch bag were then removed through the umbilical port site.  The liver bed was irrigated and inspected. Hemostasis was achieved with the electrocautery. Copious irrigation was utilized and was repeatedly aspirated until clear.    We again inspected the right upper quadrant for hemostasis.  Pneumoperitoneum was released as we removed the trocars.   The pursestring suture was used to close the umbilical fascia.  4-0 Monocryl was used to close the skin.   The skin was cleaned and dry, and Dermabond was applied. The patient was then extubated and brought to the recovery room in stable condition. Instrument,  sponge, and needle counts were correct at closure  and at the conclusion of the case.   Findings: Mild chronic cholecystitis  Estimated Blood Loss: min         Drains: none          Specimens: Gallbladder to pathology       Complications: None; patient tolerated the procedure well.         Disposition: PACU - hemodynamically stable.         Condition: stable

## 2013-04-02 NOTE — Preoperative (Signed)
Beta Blockers   Reason not to administer Beta Blockers:Not Applicable 

## 2013-04-02 NOTE — Interval H&P Note (Signed)
History and Physical Interval Note:  04/02/2013 8:46 AM  Linda Bryan  has presented today for surgery, with the diagnosis of cholelithiasis  The various methods of treatment have been discussed with the patient and family. After consideration of risks, benefits and other options for treatment, the patient has consented to  Procedure(s): LAPAROSCOPIC CHOLECYSTECTOMY (N/A) INTRAOPERATIVE CHOLANGIOGRAM (N/A) as a surgical intervention .  The patient's history has been reviewed, patient examined, no change in status, stable for surgery.  I have reviewed the patient's chart and labs.  Questions were answered to the patient's satisfaction.     Jude Linck

## 2013-04-02 NOTE — Transfer of Care (Signed)
Immediate Anesthesia Transfer of Care Note  Patient: Linda Bryan  Procedure(s) Performed: Procedure(s) (LRB): LAPAROSCOPIC CHOLECYSTECTOMY (N/A)  Patient Location: PACU  Anesthesia Type: General  Level of Consciousness: sedated, patient cooperative and responds to stimulaton  Airway & Oxygen Therapy: Patient Spontanous Breathing and Patient connected to face mask oxgen  Post-op Assessment: Report given to PACU RN and Post -op Vital signs reviewed and stable  Post vital signs: Reviewed and stable  Complications: No apparent anesthesia complications

## 2013-04-03 ENCOUNTER — Encounter (HOSPITAL_COMMUNITY): Payer: Self-pay | Admitting: General Surgery

## 2013-04-28 ENCOUNTER — Encounter (INDEPENDENT_AMBULATORY_CARE_PROVIDER_SITE_OTHER): Payer: Self-pay | Admitting: General Surgery

## 2013-04-28 ENCOUNTER — Ambulatory Visit (INDEPENDENT_AMBULATORY_CARE_PROVIDER_SITE_OTHER): Payer: 59 | Admitting: General Surgery

## 2013-04-28 VITALS — BP 110/64 | HR 84 | Resp 16 | Ht 64.0 in | Wt 117.0 lb

## 2013-04-28 DIAGNOSIS — K811 Chronic cholecystitis: Secondary | ICD-10-CM

## 2013-04-28 NOTE — Patient Instructions (Signed)
Free of all restrictions.  Follow up as needed.

## 2013-04-28 NOTE — Progress Notes (Signed)
HISTORY: Patient is a 22 year old female who is now approximately 3 weeks status post laparoscopic cholecystectomy. Her symptoms of chronic nausea are better.  She is not taking any analgesics or pain medication.  She denies fevers and chills. She has had one episode of diarrhea, but this was not bothersome.    EXAM: General:  Alert and oriented.   Incision:  Healing well.   PATHOLOGY: Chronic cholecystitis without calculus.     ASSESSMENT AND PLAN:   Chronic cholecystitis Pt's symptoms better.  No evidence of surgical complications.  Follow up as needed.        Maudry Diego, MD Surgical Oncology, General & Endocrine Surgery [facility]  Harlow Asa, MD Merlyn Albert, MD

## 2013-04-28 NOTE — Assessment & Plan Note (Signed)
Pt's symptoms better.  No evidence of surgical complications.  Follow up as needed.

## 2013-10-06 IMAGING — US US ABDOMEN COMPLETE
1 series · 13 of 25 positions shown · non-contrast
Comparison: CT 0006

CLINICAL DATA: Epigastric abdominal pain.  Kidney stone.

COMPLETE ABDOMINAL ULTRASOUND

[Series 1: us abdomen complete · 0.23mm/px · 13 of 85 slices shown]
[im 1/85]
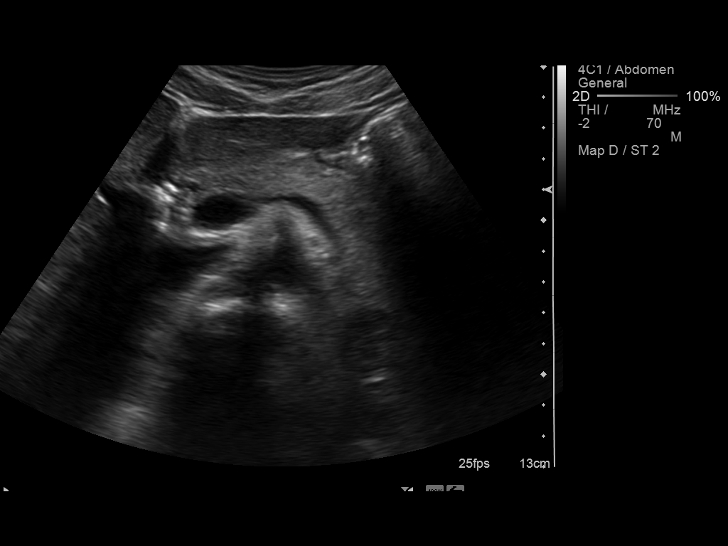
[im 8/85]
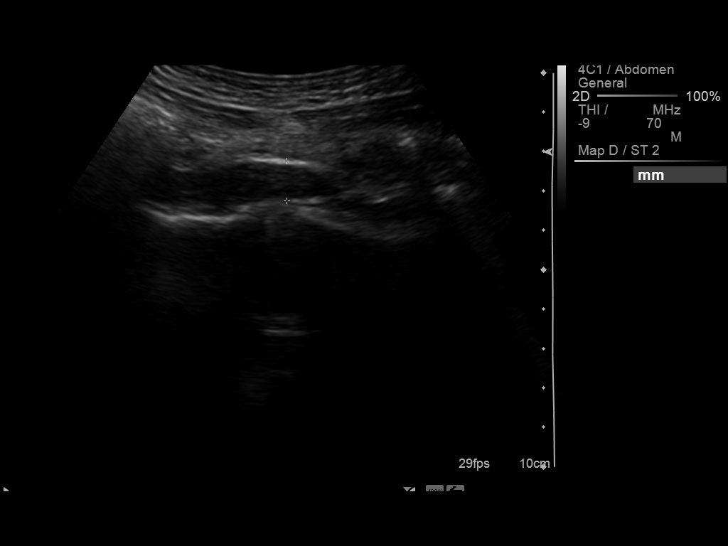
[im 15/85]
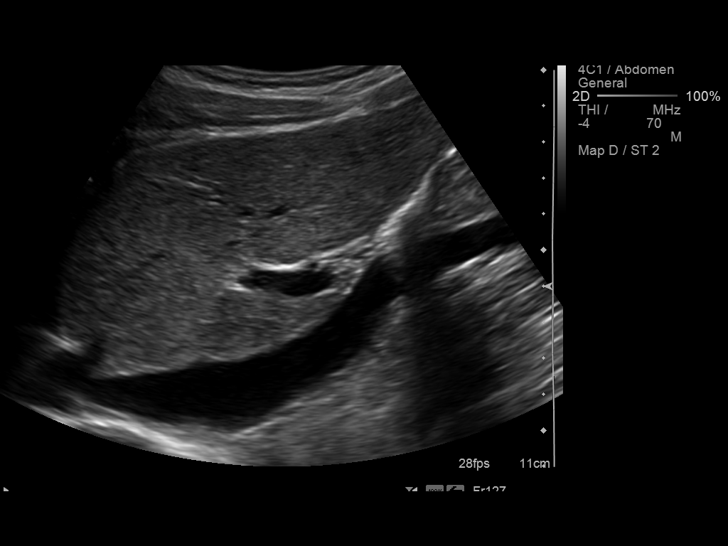
[im 22/85]
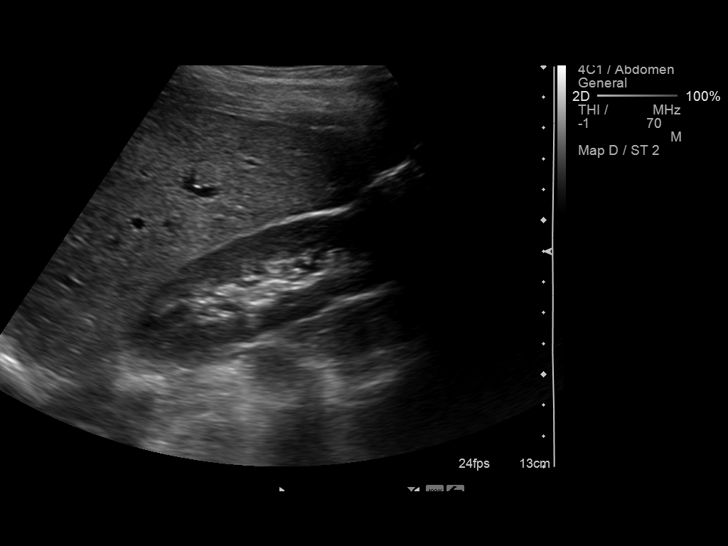
[im 29/85]
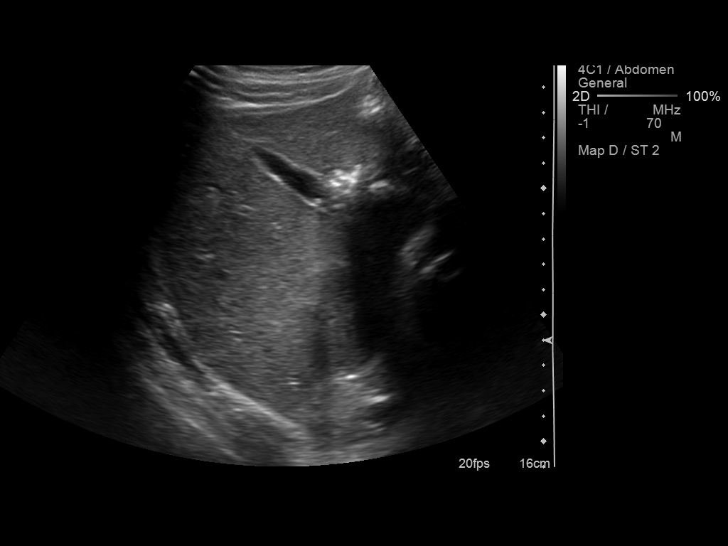
[im 36/85]
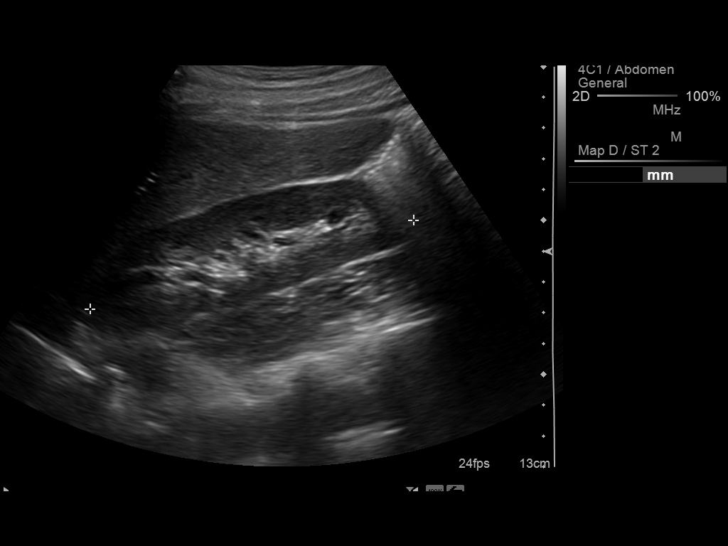
[im 43/85]
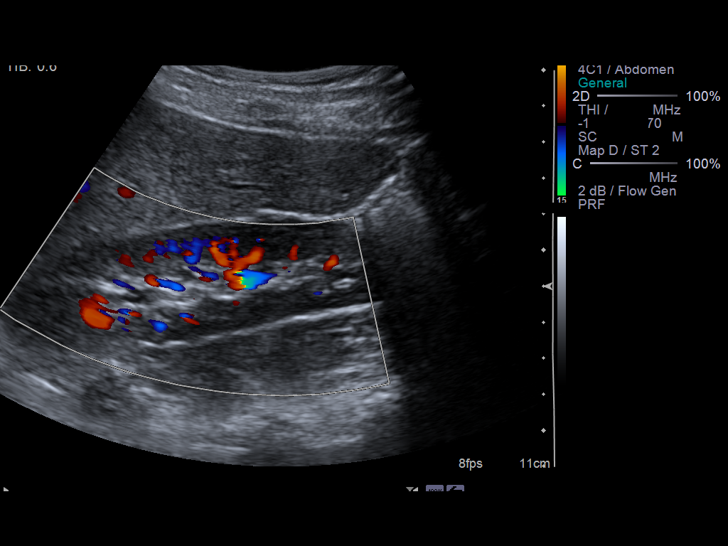
[im 50/85]
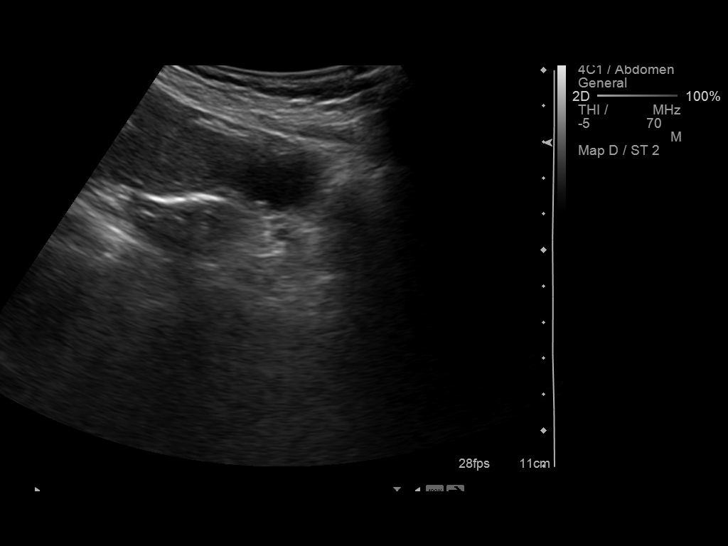
[im 57/85]
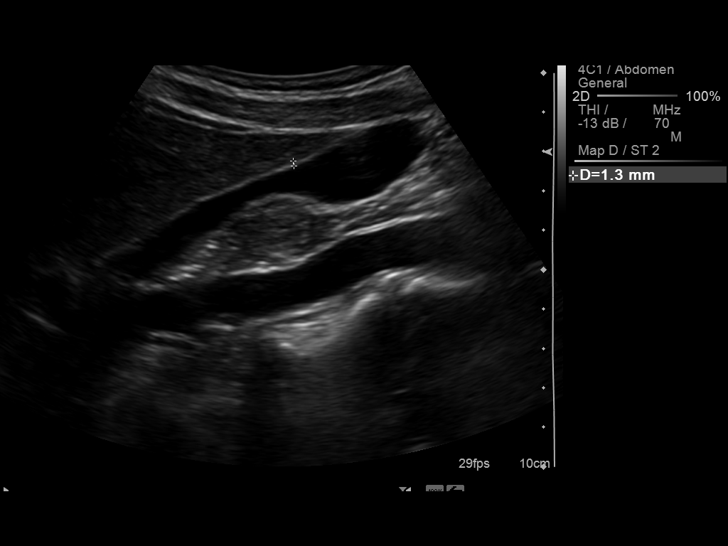
[im 64/85]
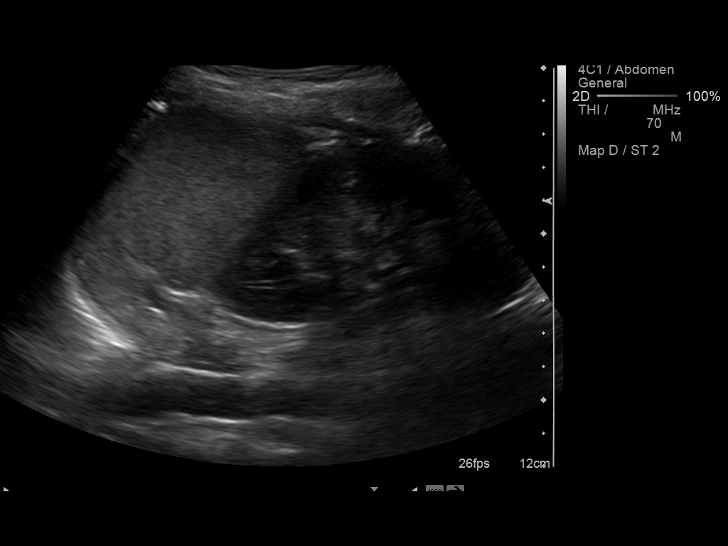
[im 71/85]
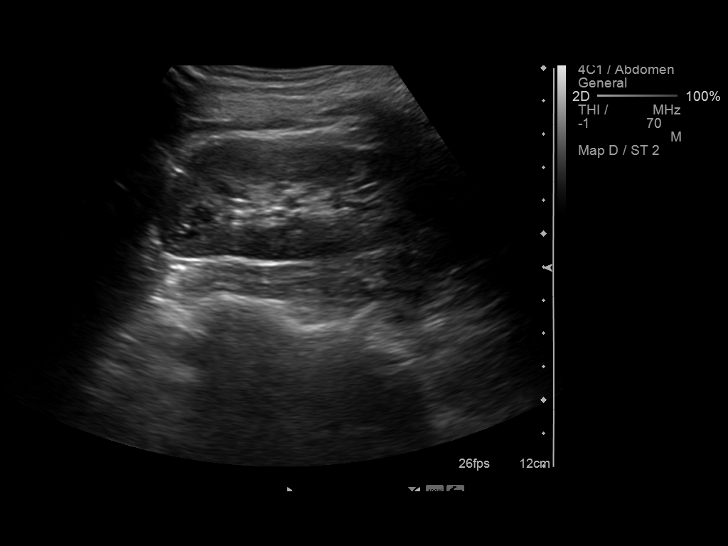
[im 78/85]
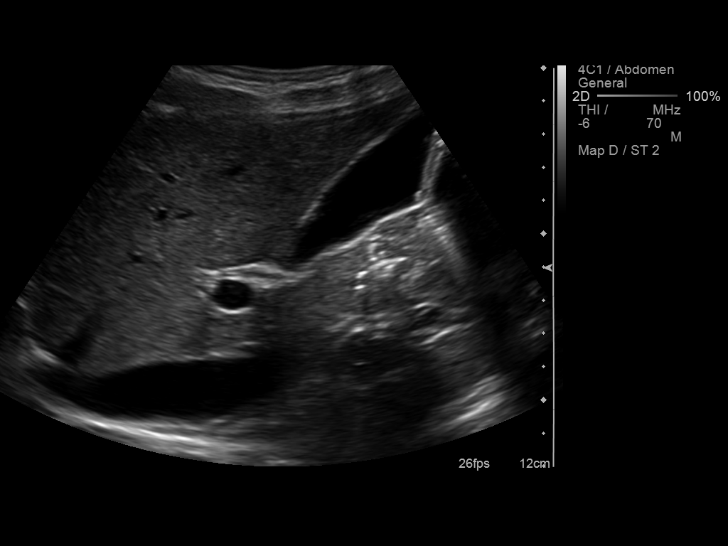
[im 85/85]
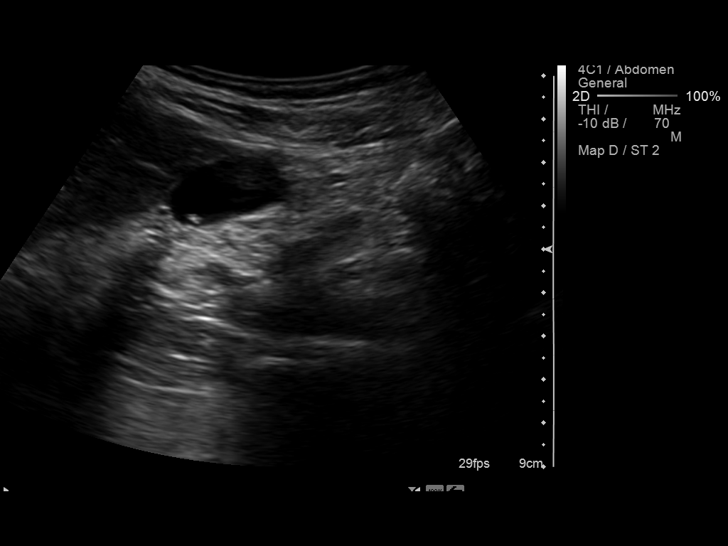

[13 of 25 positions shown; findings below may reference images not displayed]

FINDINGS: Gallbladder:  The gallbladder is well distended.  A 4 mm
nonshadowing mobile gallstone is identified.  No associated
gallbladder wall thickening or pericholecystic fluid is seen and
evaluation for a sonographic Murphy's sign is negative.

Common bile duct:  Measures 1.9 mm in diameter and has a normal
appearance

Liver:  Appears homogeneous in echotexture with no signs of focal
parenchymal abnormality or intrahepatic ductal dilatation

IVC:  The proximal portion appears normal

Pancreas:  Is well seen and appears normal in size and echotexture

Spleen:  Has a sagittal length of 8.2 cm and homogeneous
parenchymal pattern

Right Kidney:  Demonstrates a sagittal length of 11 cm.  No focal
parenchymal abnormality or signs of hydronephrosis are seen

Left Kidney:  Has a sagittal length of 10.1 cm.  No focal
parenchymal abnormalities or signs of hydronephrosis are seen

Abdominal aorta:  Demonstrates a maximal caliber of 1.3 cm with no
aneurysmal dilatation evident
IMPRESSION: Solitary nonshadowing mobile gallstone as described above with no
signs of associated cholecystitis.

Otherwise unremarkable

## 2014-01-19 ENCOUNTER — Other Ambulatory Visit: Payer: Self-pay | Admitting: Family Medicine

## 2014-03-14 ENCOUNTER — Other Ambulatory Visit: Payer: Self-pay | Admitting: Family Medicine

## 2014-03-28 ENCOUNTER — Emergency Department (INDEPENDENT_AMBULATORY_CARE_PROVIDER_SITE_OTHER)
Admission: EM | Admit: 2014-03-28 | Discharge: 2014-03-28 | Disposition: A | Discharge status: Home / Self Care | Payer: 59 | Source: Home / Self Care | Attending: Family Medicine | Admitting: Family Medicine

## 2014-03-28 ENCOUNTER — Encounter (HOSPITAL_COMMUNITY): Payer: Self-pay | Admitting: Emergency Medicine

## 2014-03-28 DIAGNOSIS — S060X0A Concussion without loss of consciousness, initial encounter: Secondary | ICD-10-CM

## 2014-03-28 DIAGNOSIS — W010XXA Fall on same level from slipping, tripping and stumbling without subsequent striking against object, initial encounter: Secondary | ICD-10-CM

## 2014-03-28 DIAGNOSIS — Y9289 Other specified places as the place of occurrence of the external cause: Secondary | ICD-10-CM

## 2014-03-28 MED ORDER — ONDANSETRON HCL 4 MG PO TABS: 4.0000 mg | ORAL_TABLET | Freq: Four times a day (QID) | ORAL | Status: DC

## 2014-03-28 NOTE — ED Provider Notes (Signed)
CSN: [id number]     Arrival date & time 03/28/14  1007 History   First MD Initiated Contact with Patient 03/28/14 1106     Chief Complaint  Patient presents with  . Headache   (Consider location/radiation/quality/duration/timing/severity/associated sxs/prior Treatment) Patient is a 23 y.o. female presenting with head injury. The history is provided by the patient and a friend.  Head Injury Location:  R temporal Time since incident:  20 hours Mechanism of injury: direct blow   Mechanism of injury comment:  Slipped in mud getting out of river and hit right head on rock, poss very brief loc, has had 4-5 episodes of vomiting since,last approx 2 hrs ago, feels better now. no mental status change. Pain details:    Quality:  Sharp   Severity:  Mild   Progression:  Improving Chronicity:  New Associated symptoms: headache, nausea and vomiting   Associated symptoms: no blurred vision, no double vision, no loss of consciousness, no memory loss, no numbness and no tinnitus     Past Medical History  Diagnosis Date  . Allergy   . Anxiety   . Dysrhythmia     tachycardia  . Anemia     hx of   Past Surgical History  Procedure Laterality Date  . Tonsillectomy and adenoidectomy    . Lithotripsy  2010  . Cholecystectomy N/A 04/02/2013    Procedure: LAPAROSCOPIC CHOLECYSTECTOMY;  Surgeon: Almond LintFaera Byerly, MD;  Location: [facility];  Service: General;  Laterality: N/A;   No family history on file. History  Substance Use Topics  . Smoking status: Never Smoker   . Smokeless tobacco: Never Used  . Alcohol Use: Yes     Comment: occasional   OB History   Grav Para Term Preterm Abortions TAB SAB Ect Mult Living                 Review of Systems  Constitutional: Negative.   HENT: Negative for tinnitus.   Eyes: Negative for blurred vision and double vision.  Gastrointestinal: Positive for nausea and vomiting. Negative for abdominal pain.  Neurological: Positive for headaches. Negative for loss of  consciousness, speech difficulty, weakness and numbness.  Psychiatric/Behavioral: Negative for memory loss.    Allergies  Review of patient's allergies indicates no known allergies.  Home Medications   Prior to Admission medications   Medication Sig Start Date End Date Taking? Authorizing Provider  PARoxetine (PAXIL-CR) 25 MG 24 hr tablet TAKE 1 TABLET BY MOUTH EVERY DAY   Yes Merlyn AlbertWilliam S Luking, MD  propranolol ER (INDERAL LA) 120 MG 24 hr capsule TAKE 1 TABLET BY MOUTH EVERY DAY 01/19/14  Yes Babs SciaraScott A Luking, MD  ondansetron (ZOFRAN) 4 MG tablet Take 1 tablet (4 mg total) by mouth every 6 (six) hours. Prn n/v 03/28/14   Linna HoffJames D Kindl, MD  oxyCODONE-acetaminophen (ROXICET) 5-325 MG per tablet Take 1-2 tablets by mouth every 4 (four) hours as needed for pain. 04/02/13   Almond LintFaera Byerly, MD   BP 123/85  Pulse 85  Temp(Src) 97.9 F (36.6 C) (Oral)  Resp 17  SpO2 100%  LMP 03/21/2014 Physical Exam  Nursing note and vitals reviewed. Constitutional: She is oriented to person, place, and time. She appears well-developed and well-nourished.  HENT:  Head: Normocephalic.  Right Ear: External ear normal.  Left Ear: External ear normal.  Nose: Nose normal.  Mouth/Throat: Oropharynx is clear and moist.  Right temporal soreness, no sts or lac  Eyes: Conjunctivae and EOM are normal. Pupils are equal,  round, and reactive to light.  Neck: Normal range of motion. Neck supple.  Cardiovascular: Regular rhythm and normal heart sounds.   Pulmonary/Chest: Breath sounds normal.  Musculoskeletal: Normal range of motion. She exhibits no tenderness.  Lymphadenopathy:    She has no cervical adenopathy.  Neurological: She is alert and oriented to person, place, and time. No cranial nerve deficit. She exhibits normal muscle tone. Coordination normal.  Skin: Skin is warm and dry.    ED Course  Procedures (including critical care time) Labs Review Labs Reviewed - No data to display  Imaging Review No  results found.   MDM   1. Concussion with no loss of consciousness, initial encounter        Linna HoffJames D Kindl, MD 03/28/14 1153

## 2014-03-28 NOTE — ED Notes (Signed)
Pt c/o headache on right side of head Reports she slipped on mud while getting out of river MirandaFell forward and hit right side of head Sx also include n/v Denies LOC, abn behavior and bleeding Alert w/no signs of acute distress.

## 2014-03-28 NOTE — ED Notes (Addendum)
Pt evaluated re: report of head injury with nausea and vomiting. Pt reports sustaining head injury with a fall yesterday evening. Pt began experiencing nausea with episodes of vomiting this morning. V/S obtained and WNL. Pt was asked to return to waiting area to await examination by provider.

## 2014-03-28 NOTE — Discharge Instructions (Signed)
Rest today , light diet as discussed advance as tolerated, no physical activity for 1 week, return to ER if problems get worse.

## 2014-07-10 ENCOUNTER — Encounter: Payer: Self-pay | Admitting: Family Medicine

## 2014-07-10 ENCOUNTER — Ambulatory Visit (INDEPENDENT_AMBULATORY_CARE_PROVIDER_SITE_OTHER): Payer: 59 | Admitting: Family Medicine

## 2014-07-10 VITALS — BP 108/70 | Temp 98.8°F | Ht 64.0 in | Wt 117.6 lb

## 2014-07-10 DIAGNOSIS — J329 Chronic sinusitis, unspecified: Secondary | ICD-10-CM

## 2014-07-10 MED ORDER — CEFDINIR 300 MG PO CAPS
300.0000 mg | ORAL_CAPSULE | Freq: Two times a day (BID) | ORAL | Status: DC
Start: 2014-07-10 — End: 2015-06-23

## 2014-07-10 NOTE — Progress Notes (Signed)
   Subjective:    Patient ID: Linda Bryan, female    DOB: January 27, 1991, 23 y.o.   MRN: [id number]  Sinusitis The current episode started 1 to 4 weeks ago. Associated symptoms include coughing and headaches. (Scratchy throat, stuffy nose, fever) Treatments tried: mucinex dm, ibuprofen, cough syrup, tylenol. The treatment provided mild relief.   Around tend d ago  Developed fever achey and diminished energy  Muscle aches and achehiness thruout  No fever  Some sore throt scratch and drainge''   Review of Systems  Respiratory: Positive for cough.   Neurological: Positive for headaches.   No vomiting no diarrhea no rash ROS otherwise negative    Objective:   Physical Exam  Alert mild malaise. Vital stable. HEENT frontal maxillary tenderness pharynx congestion erythema throat neck supple. Lungs clear. Heart regular in rhythm. No      Assessment & Plan:  Impression rhinosinusitis plan antibiotics prescribed. Symptomatic care discussed. Warning signs discussed. WSL

## 2014-07-20 ENCOUNTER — Telehealth: Payer: Self-pay | Admitting: Family Medicine

## 2014-07-20 MED ORDER — CLARITHROMYCIN 500 MG PO TABS: 500.0000 mg | ORAL_TABLET | Freq: Two times a day (BID) | ORAL | Status: DC

## 2014-07-20 NOTE — Telephone Encounter (Signed)
Medication sent to pharmacy. Patient was notified.  

## 2014-07-20 NOTE — Telephone Encounter (Signed)
Patient was seen 10/2 with sinus infection she stated finished up antibotic .But still sick and wants another round [facility] [address]

## 2014-07-20 NOTE — Telephone Encounter (Signed)
Was prescribed Omnicef 300 mg BID x 10 days

## 2014-07-20 NOTE — Telephone Encounter (Signed)
biaxin 500 bid ten d 

## 2014-07-20 NOTE — Addendum Note (Signed)
Addended by: Dereck LigasJOHNSON, Everlean Bucher P on: 07/20/2014 04:34 PM   Modules accepted: Orders

## 2015-06-23 ENCOUNTER — Encounter: Payer: Self-pay | Admitting: Obstetrics and Gynecology

## 2015-06-23 ENCOUNTER — Ambulatory Visit (INDEPENDENT_AMBULATORY_CARE_PROVIDER_SITE_OTHER): Payer: 59 | Admitting: Obstetrics and Gynecology

## 2015-06-23 VITALS — BP 95/63 | HR 82 | Ht 65.0 in | Wt 118.4 lb

## 2015-06-23 DIAGNOSIS — Z87410 Personal history of cervical dysplasia: Secondary | ICD-10-CM | POA: Diagnosis not present

## 2015-06-23 DIAGNOSIS — T8389XA Other specified complication of genitourinary prosthetic devices, implants and grafts, initial encounter: Secondary | ICD-10-CM

## 2015-06-23 DIAGNOSIS — T8332XA Displacement of intrauterine contraceptive device, initial encounter: Secondary | ICD-10-CM

## 2015-06-23 NOTE — Progress Notes (Signed)
Patient ID: Linda Bryan, female   DOB: 1991-05-08, 24 y.o.   MRN: [id number] 6 month pap colpo 12/2014 cin 1  Chief complaint: 1.  History of cervical dysplasia.  Patient presents for 6 month interval Pap smear.  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  ECC was negative at that time.  Past Medical History  Diagnosis Date  . Allergy   . Anxiety   . Dysrhythmia     tachycardia  . Anemia     hx of   Past Surgical History  Procedure Laterality Date  . Tonsillectomy and adenoidectomy    . Lithotripsy  2010  . Cholecystectomy N/A 04/02/2013    Procedure: LAPAROSCOPIC CHOLECYSTECTOMY;  Surgeon: Almond Lint, MD;  Location: [facility];  Service: General;  Laterality: N/A;   Review of Systems  Constitutional: Negative.   Respiratory: Negative.   Cardiovascular: Negative.   Gastrointestinal: Negative.   Genitourinary: Negative.   Musculoskeletal: Negative.   Skin: Negative.    OBJECTIVE: BP 95/63 mmHg  Pulse 82  Ht  (1.651 m)  Wt 118 lb 6.4 oz (53.706 kg)  BMI 19.70 kg/m2  LMP 06/21/2015 Well-appearing white female in no acute distress. Pelvic exam: External genitalia-normal BUS-normal Vagina-menstrual blood in vault, mild to moderate. Cervix-IUD string appears elongated measuring 4+ centimeters; possible to remove IUD is palpated at cervical os; no cervical motion tenderness. Uterus-midplane, normal size and shape, nontender. Adnexa-nonpalpable and nontender.  IMPRESSION: 1.  History of dysplasia, in need of 6 month interval Pap smear. 2.  Possible IUD migration.  PLAN: 1.  Pap smear, if ASCUS 2.  Pelvic ultrasound to assess for IUD location. 3.  Use condoms for backup contraception until ultrasound results are available. 4.  Return 6 months for interval Pap smear. 5.  Will contact patient regarding ultrasound.  IUD location and arrange for possible removal and reinsertion if desired.  Herold Harms, MD

## 2015-06-23 NOTE — Patient Instructions (Signed)
1.  Pap smear is done today. 2.  Pelvic ultrasound is done to assess for IUD location (possible IUD migration). 3.  Use condoms for backup contraception until ultrasound is obtained. 4.  Possible need for removal and reinsertion of IUD.  Will discuss with patient this possibility.  After ultrasound is obtained. 5.  Return in 6 months for repeat Pap.

## 2015-06-28 LAB — PAP IG W/ RFLX HPV ASCU: PAP Smear Comment: 0

## 2015-07-01 ENCOUNTER — Ambulatory Visit: Payer: Self-pay | Admitting: Obstetrics and Gynecology

## 2015-07-07 ENCOUNTER — Ambulatory Visit: Payer: 59

## 2015-07-07 DIAGNOSIS — T8332XA Displacement of intrauterine contraceptive device, initial encounter: Secondary | ICD-10-CM

## 2015-07-07 DIAGNOSIS — T8389XA Other specified complication of genitourinary prosthetic devices, implants and grafts, initial encounter: Secondary | ICD-10-CM | POA: Diagnosis not present

## 2015-07-08 ENCOUNTER — Telehealth: Payer: Self-pay | Admitting: Obstetrics and Gynecology

## 2015-07-08 ENCOUNTER — Encounter: Payer: Self-pay | Admitting: Obstetrics and Gynecology

## 2015-07-08 ENCOUNTER — Ambulatory Visit: Payer: Self-pay | Admitting: Obstetrics and Gynecology

## 2015-07-08 ENCOUNTER — Ambulatory Visit (INDEPENDENT_AMBULATORY_CARE_PROVIDER_SITE_OTHER): Payer: 59 | Admitting: Obstetrics and Gynecology

## 2015-07-08 VITALS — BP 99/66 | HR 88 | Ht 64.0 in | Wt 118.7 lb

## 2015-07-08 DIAGNOSIS — Z30432 Encounter for removal of intrauterine contraceptive device: Secondary | ICD-10-CM

## 2015-07-08 DIAGNOSIS — T8332XD Displacement of intrauterine contraceptive device, subsequent encounter: Secondary | ICD-10-CM

## 2015-07-08 DIAGNOSIS — Z3009 Encounter for other general counseling and advice on contraception: Secondary | ICD-10-CM

## 2015-07-08 DIAGNOSIS — T8389XD Other specified complication of genitourinary prosthetic devices, implants and grafts, subsequent encounter: Secondary | ICD-10-CM | POA: Diagnosis not present

## 2015-07-08 NOTE — Patient Instructions (Signed)
Contraception Choices Contraception (birth control) is the use of any methods or devices to prevent pregnancy. Below are some methods to help avoid pregnancy. HORMONAL METHODS   Contraceptive implant. This is a thin, plastic tube containing progesterone hormone. It does not contain estrogen hormone. Your health care Danyah Guastella inserts the tube in the inner part of the upper arm. The tube can remain in place for up to 3 years. After 3 years, the implant must be removed. The implant prevents the ovaries from releasing an egg (ovulation), thickens the cervical mucus to prevent sperm from entering the uterus, and thins the lining of the inside of the uterus.  Progesterone-only injections. These injections are given every 3 months by your health care Roper Tolson to prevent pregnancy. This synthetic progesterone hormone stops the ovaries from releasing eggs. It also thickens cervical mucus and changes the uterine lining. This makes it harder for sperm to survive in the uterus.  Birth control pills. These pills contain estrogen and progesterone hormone. They work by preventing the ovaries from releasing eggs (ovulation). They also cause the cervical mucus to thicken, preventing the sperm from entering the uterus. Birth control pills are prescribed by a health care Benicia Bergevin.Birth control pills can also be used to treat heavy periods.  Minipill. This type of birth control pill contains only the progesterone hormone. They are taken every day of each month and must be prescribed by your health care Alexis Reber.  Birth control patch. The patch contains hormones similar to those in birth control pills. It must be changed once a week and is prescribed by a health care Mikella Linsley.  Vaginal ring. The ring contains hormones similar to those in birth control pills. It is left in the vagina for 3 weeks, removed for 1 week, and then a new one is put back in place. The patient must be comfortable inserting and removing the ring  from the vagina.A health care Gigi Onstad's prescription is necessary.  Emergency contraception. Emergency contraceptives prevent pregnancy after unprotected sexual intercourse. This pill can be taken right after sex or up to 5 days after unprotected sex. It is most effective the sooner you take the pills after having sexual intercourse. Most emergency contraceptive pills are available without a prescription. Check with your pharmacist. Do not use emergency contraception as your only form of birth control. BARRIER METHODS   Female condom. This is a thin sheath (latex or rubber) that is worn over the penis during sexual intercourse. It can be used with spermicide to increase effectiveness.  Female condom. This is a soft, loose-fitting sheath that is put into the vagina before sexual intercourse.  Diaphragm. This is a soft, latex, dome-shaped barrier that must be fitted by a health care Madalee Altmann. It is inserted into the vagina, along with a spermicidal jelly. It is inserted before intercourse. The diaphragm should be left in the vagina for 6 to 8 hours after intercourse.  Cervical cap. This is a round, soft, latex or plastic cup that fits over the cervix and must be fitted by a health care Ordean Fouts. The cap can be left in place for up to 48 hours after intercourse.  Sponge. This is a soft, circular piece of polyurethane foam. The sponge has spermicide in it. It is inserted into the vagina after wetting it and before sexual intercourse.  Spermicides. These are chemicals that kill or block sperm from entering the cervix and uterus. They come in the form of creams, jellies, suppositories, foam, or tablets. They do not require a   prescription. They are inserted into the vagina with an applicator before having sexual intercourse. The process must be repeated every time you have sexual intercourse. INTRAUTERINE CONTRACEPTION  Intrauterine device (IUD). This is a T-shaped device that is put in a woman's uterus  during a menstrual period to prevent pregnancy. There are 2 types:  Copper IUD. This type of IUD is wrapped in copper wire and is placed inside the uterus. Copper makes the uterus and fallopian tubes produce a fluid that kills sperm. It can stay in place for 10 years.  Hormone IUD. This type of IUD contains the hormone progestin (synthetic progesterone). The hormone thickens the cervical mucus and prevents sperm from entering the uterus, and it also thins the uterine lining to prevent implantation of a fertilized egg. The hormone can weaken or kill the sperm that get into the uterus. It can stay in place for 3-5 years, depending on which type of IUD is used. PERMANENT METHODS OF CONTRACEPTION  Female tubal ligation. This is when the woman's fallopian tubes are surgically sealed, tied, or blocked to prevent the egg from traveling to the uterus.  Hysteroscopic sterilization. This involves placing a small coil or insert into each fallopian tube. Your doctor uses a technique called hysteroscopy to do the procedure. The device causes scar tissue to form. This results in permanent blockage of the fallopian tubes, so the sperm cannot fertilize the egg. It takes about 3 months after the procedure for the tubes to become blocked. You must use another form of birth control for these 3 months.  Female sterilization. This is when the female has the tubes that carry sperm tied off (vasectomy).This blocks sperm from entering the vagina during sexual intercourse. After the procedure, the man can still ejaculate fluid (semen). NATURAL PLANNING METHODS  Natural family planning. This is not having sexual intercourse or using a barrier method (condom, diaphragm, cervical cap) on days the woman could become pregnant.  Calendar method. This is keeping track of the length of each menstrual cycle and identifying when you are fertile.  Ovulation method. This is avoiding sexual intercourse during ovulation.  Symptothermal  method. This is avoiding sexual intercourse during ovulation, using a thermometer and ovulation symptoms.  Post-ovulation method. This is timing sexual intercourse after you have ovulated. Regardless of which type or method of contraception you choose, it is important that you use condoms to protect against the transmission of sexually transmitted infections (STIs). Talk with your health care Adeliz Tonkinson about which form of contraception is most appropriate for you. Document Released: 09/25/2005 Document Revised: 09/30/2013 Document Reviewed: 03/20/2013 [employer] Patient Information 2015 [employer]. This information is not intended to replace advice given to you by your health care Nalayah Hitt. Make sure you discuss any questions you have with your health care Clide Remmers.  

## 2015-07-08 NOTE — Telephone Encounter (Signed)
Pt aware pap neg.

## 2015-07-08 NOTE — Telephone Encounter (Signed)
PT WAS HERE AND FORGOT TO ASK ABOUT HER PAP RESULTS FROM ABOUT A WEEK AGO.

## 2015-07-08 NOTE — Progress Notes (Signed)
GYNECOLOGY PROGRESS NOTE  Subjective:    Patient ID: Linda Bryan, female    DOB: 1991/06/21, 24 y.o.   MRN: [id number]  HPI  Patient is a 24 y.o. P0 female who presents for f/u after ultrasound for lengthened Paraguard IUD strings.  Denies complaints.  Patient's last menstrual period was 06/21/2015 (exact date).  Denies unprotected intercourse within the past 2 weeks.   The following portions of the patient's history were reviewed and updated as appropriate: allergies, current medications, past family history, past medical history, past social history, past surgical history and problem list.  Review of Systems A comprehensive review of systems was negative.   Objective:   Blood pressure 99/66, pulse 88, height  (1.626 m), weight 118 lb 11.2 oz (53.842 kg), last menstrual period 06/21/2015. General appearance: alert and no distress  Skin: Skin color, texture, turgor normal. No rashes or lesions Abdomen: soft, non-tender; bowel sounds normal; no masses,  no organomegaly Pelvic: external genitalia normal and rectovaginal septum normal.  Vagina with small amount of white mucoid discharge, no odor.  Cervix normal appearing, no lesions.  IUD threads lengthened.  Uterus mobile, normal size, shape, consistency. Adnexae nontender, non-palpable.   Extremities: extremities normal, atraumatic, no cyanosis or edema Pulses: 2+ and symmetric Neurologic: Grossly normal   Imaging (Pelvic Ultrasound 07/07/2015):  Findings:  The uterus measures 8.2 x 3.0 x 4.4 cm. Echo texture is homogenous without evidence of focal masses. The Endometrium measures 7.0 mm. The IUD appears to be malpositioned and within the LUS. Part of the T portion of the IUD appears to be within the uterine myometrium   Right Ovary measures 3.1 x 2.2 x 2.7 cm. It is normal in appearance. Left Ovary measures 2.2 x 1.4 x 1.7 cm. It is normal appearance. Survey of the adnexa demonstrates no adnexal masses. There is no free fluid in  the cul de sac.  Impression: 1. IUD appears to be in improper location with the LUS.    Assessment:   1. Malpositioned IUD.   Plan:   1.  Discussed ultrasound results and need for IUD removal as it is malpositioned and therefore ineffective for contraception currently.  Patient notes understanding, IUD removed per procedure note below.  2. Reviewed all forms of birth control options available including abstinence; over the counter/barrier methods; hormonal contraceptive medication including pill, patch, ring, injection,contraceptive implant; hormonal and nonhormonal IUDs; Risks and benefits reviewed.  Questions were answered.  Information was given to patient to review.   Patient would like to avoid hormonal methods if possible.  Would like to think over options. To f/u once decision has made on next method of contraception.  .  IUD Removal Procedure Note Patient identified, informed consent performed.  Patient was in the dorsal lithotomy position, normal external genitalia was noted.  A speculum was placed in the patient's vagina, normal discharge was noted, no lesions. The cervix was visualized, no lesions, no abnormal discharge.  The strings of the IUD were grasped and pulled using ring forceps. The IUD was removed in its entirety. Patient tolerated the procedure well.    Patient unsure of future contraceptive methods, education and information given.  Routine preventative health maintenance measures emphasized.   A total of 15 minutes were spent face-to-face with the patient during this encounter and over half of that time dealt with counseling and coordination of care.   Hildred Laser, MD [facility]

## 2015-12-14 ENCOUNTER — Encounter: Payer: Self-pay | Admitting: Obstetrics and Gynecology

## 2015-12-29 ENCOUNTER — Ambulatory Visit: Payer: 59 | Admitting: Obstetrics and Gynecology

## 2019-11-10 ENCOUNTER — Encounter: Payer: Self-pay | Admitting: Family Medicine
